# Patient Record
Sex: Male | Born: 1990 | Race: Black or African American | Hispanic: No | Marital: Married | Smoking: Heavy tobacco smoker
Health system: Southern US, Community
[De-identification: ages and names within clinical notes are randomized; demographics above are authoritative.]

---

## 2013-03-06 ENCOUNTER — Encounter (HOSPITAL_COMMUNITY): Payer: Self-pay | Admitting: *Deleted

## 2013-03-06 ENCOUNTER — Emergency Department (HOSPITAL_COMMUNITY)
Admission: EM | Admit: 2013-03-06 | Discharge: 2013-03-06 | Disposition: A | Discharge status: Home / Self Care | Payer: 59 | Source: Home / Self Care

## 2013-03-06 ENCOUNTER — Other Ambulatory Visit (HOSPITAL_COMMUNITY)
Admission: RE | Admit: 2013-03-06 | Discharge: 2013-03-06 | Disposition: A | Discharge status: Home / Self Care | Payer: 59 | Source: Ambulatory Visit | Attending: Emergency Medicine | Admitting: Emergency Medicine

## 2013-03-06 DIAGNOSIS — Z202 Contact with and (suspected) exposure to infections with a predominantly sexual mode of transmission: Secondary | ICD-10-CM

## 2013-03-06 DIAGNOSIS — Z113 Encounter for screening for infections with a predominantly sexual mode of transmission: Secondary | ICD-10-CM | POA: Insufficient documentation

## 2013-03-06 DIAGNOSIS — Z2089 Contact with and (suspected) exposure to other communicable diseases: Secondary | ICD-10-CM

## 2013-03-06 LAB — HIV ANTIBODY (ROUTINE TESTING W REFLEX): HIV: NONREACTIVE

## 2013-03-06 MED ORDER — AZITHROMYCIN 250 MG PO TABS
1000.0000 mg | ORAL_TABLET | Freq: Once | ORAL | Status: AC
  Administered 2013-03-06: 1000 mg via ORAL

## 2013-03-06 MED ORDER — AZITHROMYCIN 250 MG PO TABS
ORAL_TABLET | ORAL | Status: AC
  Filled 2013-03-06: qty 4

## 2013-03-06 MED ORDER — CEFTRIAXONE SODIUM 250 MG IJ SOLR
INTRAMUSCULAR | Status: AC
  Filled 2013-03-06: qty 250

## 2013-03-06 MED ORDER — CEFTRIAXONE SODIUM 250 MG IJ SOLR
250.0000 mg | Freq: Once | INTRAMUSCULAR | Status: AC
  Administered 2013-03-06: 250 mg via INTRAMUSCULAR

## 2013-03-06 MED ORDER — LIDOCAINE HCL (PF) 1 % IJ SOLN
INTRAMUSCULAR | Status: AC
  Filled 2013-03-06: qty 5

## 2013-03-06 NOTE — ED Provider Notes (Signed)
Chief Complaint:   Chief Complaint  Patient presents with  . Exposure to STD    History of Present Illness:   Adam Rich is a 22 year old male who's wife recently told him that she tested positive for Chlamydia. He is married and a mutually monogamous relationship. The last time here them had any relations with anyone outside the marriage was over a year ago, before they got married. He himself is asymptomatic as is his wife. He denies any urethral discharge, burning, dysuria, or penile lesions. He's had no adenopathy, testicular pain or swelling, fever, chills, or skin rash.  Review of Systems:  Other than noted above, the patient denies any of the following symptoms: Systemic:  No fevers chills, aches, weight loss, arthralgias, myalgias, or adenopathy. GI:  No abdominal pain, nausea or vomiting. GU:  No dysuria, penile pain, discharge, itching, dysuria, genital lesions, testicular pain or swelling. Skin:  No rash or itching.  PMFSH:  Past medical history, family history, social history, meds, and allergies were reviewed.   Physical Exam:   Vital signs:  BP 120/80  Pulse 88  Temp(Src) 98.6 F (37 C) (Oral)  Resp 14  SpO2 98% Gen:  Alert, oriented, in no distress. Abdomen:  Soft and flat, non-distended, and non-tender.  No organomegaly or mass. Genital:  Normal genital exam. No urethral discharge, no penile lesions, no inguinal adenopathy, testes are normal in size, nontender, and without any masses. Skin:  Warm and dry.  No rash.   Labs:  DNA probes for gonorrhea, Chlamydia, Trichomonas obtained as well as serologies for HIV and syphilis.   Course in Urgent Care Center:  He was given Rocephin 250 mg IM and azithromycin 1000 mg by mouth.  Assessment:  The encounter diagnosis was Exposure to STD.  Plan:   1.  The following meds were prescribed:  There are no discharge medications for this patient.  2.  The patient was instructed in symptomatic care and handouts were  given. 3.  The patient was told to return if becoming worse in any way, if no better in 3 or 4 days, and given some red flag symptoms such as difficulty urinating or fever that would indicate earlier return. 4.  The patient was instructed to inform all sexual contacts, avoid intercourse completely for 2 weeks and then only with a condom.  The patient was told that we would call about all abnormal lab results, and that we would need to report certain kinds of infection to the health department. 5.  Follow up here if needed.    Reuben Likes, MD 03/06/13 [phone]

## 2013-03-06 NOTE — ED Notes (Signed)
Patient states that he was exposed to chlamydia by his wife 2-3 days ago; denies dysuria, fever/chills.

## 2013-03-09 NOTE — ED Notes (Signed)
GC neg., Chlamydia pos., Trich neg., HIV/RPR non-reactive.  Pt. Adequately treated with Zithromax and also got Rocephin.  Pt. needs notified.

## 2013-03-10 ENCOUNTER — Telehealth (HOSPITAL_COMMUNITY): Payer: Self-pay | Admitting: *Deleted

## 2013-03-10 NOTE — ED Notes (Signed)
I called pt.  Pt. verified x 2 and given results.  Pt. told he was adequately treated with Zithromax.  Pt. instructed to notify his partner, no sex for 1 week and to practice safe sex. Pt. told he should get HIV rechecked in 6 mos. at the [facility] STD clinic, by appointment.  [facility] form completed and faxed to the [facility]. Vassie Moselle 03/10/2013

## 2015-07-20 ENCOUNTER — Emergency Department (HOSPITAL_COMMUNITY)
Admission: EM | Admit: 2015-07-20 | Discharge: 2015-07-20 | Disposition: A | Discharge status: Home / Self Care | Payer: 59 | Attending: Emergency Medicine | Admitting: Emergency Medicine

## 2015-07-20 ENCOUNTER — Emergency Department (HOSPITAL_COMMUNITY): Payer: 59

## 2015-07-20 ENCOUNTER — Encounter (HOSPITAL_COMMUNITY): Payer: Self-pay | Admitting: Emergency Medicine

## 2015-07-20 DIAGNOSIS — W11XXXA Fall on and from ladder, initial encounter: Secondary | ICD-10-CM | POA: Diagnosis not present

## 2015-07-20 DIAGNOSIS — Y9389 Activity, other specified: Secondary | ICD-10-CM | POA: Insufficient documentation

## 2015-07-20 DIAGNOSIS — S3992XA Unspecified injury of lower back, initial encounter: Secondary | ICD-10-CM | POA: Insufficient documentation

## 2015-07-20 DIAGNOSIS — M545 Low back pain, unspecified: Secondary | ICD-10-CM

## 2015-07-20 DIAGNOSIS — Y9289 Other specified places as the place of occurrence of the external cause: Secondary | ICD-10-CM | POA: Diagnosis not present

## 2015-07-20 DIAGNOSIS — Y990 Civilian activity done for income or pay: Secondary | ICD-10-CM | POA: Diagnosis not present

## 2015-07-20 DIAGNOSIS — F17210 Nicotine dependence, cigarettes, uncomplicated: Secondary | ICD-10-CM | POA: Insufficient documentation

## 2015-07-20 MED ORDER — KETOROLAC TROMETHAMINE 30 MG/ML IJ SOLN
30.0000 mg | Freq: Once | INTRAMUSCULAR | Status: AC
  Administered 2015-07-20: 30 mg via INTRAMUSCULAR
  Filled 2015-07-20: qty 1

## 2015-07-20 NOTE — ED Provider Notes (Signed)
CSN: [id number]     Arrival date & time 07/20/15  0906 History   None    Chief Complaint  Patient presents with  . Fall  . Back Pain   (Consider location/radiation/quality/duration/timing/severity/associated sxs/prior Treatment) HPI  24 y.o. male presents to the Emergency Department today complaining of lower back pain. The patient fell from a ladder 10+ feet on 07-14-15 onto gravel while at work. No head trauma or LOC. He was able to ambulate at the scene afterwards and carry on with his tasks that day. No loss of bowel or bladder function. Presents today because the pain has not been getting any better. Pain is 6/10. He tried Ibuprofen 1 tab with little relief. Pt also complains of L shoulder pain. No numbness/tingling.     History reviewed. No pertinent past medical history. History reviewed. No pertinent past surgical history. History reviewed. No pertinent family history. Social History  Substance Use Topics  . Smoking status: Heavy Tobacco Smoker -- 0.50 packs/day    Types: Cigarettes  . Smokeless tobacco: None  . Alcohol Use: Yes     Comment: occasionlly    Review of Systems  Constitutional: Negative for fever, chills, diaphoresis and fatigue.  HENT: Negative for congestion, sinus pressure, sore throat and tinnitus.   Eyes: Negative for visual disturbance.  Respiratory: Negative for cough and shortness of breath.   Cardiovascular: Negative for chest pain.  Gastrointestinal: Negative for nausea, vomiting, abdominal pain, diarrhea and constipation.  Endocrine: Negative for cold intolerance and heat intolerance.  Genitourinary: Negative for dysuria and hematuria.  Musculoskeletal: Positive for back pain.  Skin: Negative for color change.  Neurological: Negative for dizziness, syncope, weakness, numbness and headaches.   Allergies  Review of patient's allergies indicates no known allergies.  Home Medications   Prior to Admission medications   Not on File   BP 146/84  mmHg  Pulse 100  Temp(Src) 99.4 F (37.4 C) (Oral)  Resp 20  SpO2 98% Physical Exam  Constitutional: He is oriented to person, place, and time. Vital signs are normal. He appears well-developed and well-nourished.  HENT:  Head: Normocephalic.  Right Ear: Hearing normal.  Left Ear: Hearing normal.  Eyes: Conjunctivae and EOM are normal. Pupils are equal, round, and reactive to light.  Cardiovascular: Normal rate and regular rhythm.   Pulmonary/Chest: Effort normal and breath sounds normal.  Musculoskeletal:       Left shoulder: Normal. He exhibits normal range of motion, no tenderness, no bony tenderness, no deformity and no pain.       Cervical back: Normal. He exhibits normal range of motion and no tenderness.       Thoracic back: Normal. He exhibits normal range of motion and no tenderness.       Lumbar back: He exhibits tenderness. He exhibits normal range of motion.  Neurological: He is alert and oriented to person, place, and time. He has normal strength. No cranial nerve deficit or sensory deficit.  Skin: Skin is warm and dry.  Psychiatric: He has a normal mood and affect. His speech is normal and behavior is normal. Thought content normal.    ED Course  Procedures (including critical care time) Labs Review Labs Reviewed - No data to display  Imaging Review Dg Lumbar Spine Complete  07/20/2015  CLINICAL DATA:  Fall 10 feet off ladder 2 days ago.  Low back pain. EXAM: LUMBAR SPINE - COMPLETE 4+ VIEW COMPARISON:  None. FINDINGS: There is no evidence of lumbar spine fracture. Alignment is  normal. Intervertebral disc spaces are maintained. IMPRESSION: Negative. Electronically Signed   By: Charlett NoseKevin  Dover M.D.   On: 07/20/2015 11:20   I have personally reviewed and evaluated these images and lab results as part of my medical decision-making.   EKG Interpretation None      MDM  I have reviewed relevant imaging studies.I obtained HPI from historian.  ED Course: Toradol 30  IM Lumbar Xray- Negative  Assessment: 24yM no sig pmh with lower back pain after fall 310ft from ladder onto gravel. No LOC, No loss of bowel or bladder. VS stable. Pt in NAD. Full ROM L shoulder and Lumbar spine. Pt able to ambulate well. Due to tenderness on palpation of L spine, will order Xray of Lumbar for further evaluation. If unchanged, will d/c with paperwork on back exercises and to take NSAIDs for relief. Follow up with PCP for further management.    Disposition/Plan:  DC home w/ follow up to PCP Additional Verbal discharge instructions given and discussed with patient.  Pt Instructed to f/u with PCP in the next 48 hours for evaluation and treatment of symptoms.   Supervising Physician Cathren LaineKevin Steinl, MD   Final diagnoses:  Bilateral low back pain without sciatica       Audry Piliyler Billyjoe Go, PA-C 07/20/15 2240  Cathren LaineKevin Steinl, MD 07/22/15 [id number]

## 2015-07-20 NOTE — ED Notes (Signed)
Pt c/o lower back pain radiating to shoulder onset after fall from 10 feet onto gravel on 07-14-15. No head injury injury, no LOC, no anticoagulant therapy. No loss of control of bowel or bladder, ambulatory.

## 2015-07-20 NOTE — ED Notes (Signed)
Bed: [id number] Expected date:  Expected time:  Means of arrival:  Comments: Tr 2

## 2015-07-20 NOTE — Discharge Instructions (Signed)
Please read and follow all provided instructions.  Your diagnoses today include:  1. Bilateral low back pain without sciatica    Tests performed today include:  Vital signs - see below for your results today  Medications prescribed:  None  Home care instructions:   Follow any educational materials contained in this packet  Please rest, use ice or heat on your back for the next several days  Do not lift, push, pull anything more than 10 pounds for the next week  Follow-up instructions: Please follow-up with your primary care provider in the next 48 hours for treatment of further symptoms   Return instructions:  SEEK IMMEDIATE MEDICAL ATTENTION IF YOU HAVE:  New numbness, tingling, weakness, or problem with the use of your arms or legs  Severe back pain not relieved with medications  Loss control of your bowels or bladder  Increasing pain in any areas of the body (such as chest or abdominal pain)  Shortness of breath, dizziness, or fainting.   Worsening nausea (feeling sick to your stomach), vomiting, fever, or sweats  Any other emergent concerns regarding your health   Additional Information:  Your vital signs today were: BP 146/84 mmHg   Pulse 100   Temp(Src) 99.4 F (37.4 C) (Oral)   Resp 20   SpO2 98% If your blood pressure (BP) was elevated above 135/85 this visit, please have this repeated by your doctor within one month. --------------

## 2015-07-20 NOTE — ED Notes (Signed)
Bed: WA26 Expected date:  Expected time:  Means of arrival:  Comments: 

## 2017-05-06 IMAGING — CR DG LUMBAR SPINE COMPLETE 4+V
5 series · 5 of 5 positions shown · non-contrast
Comparison: None.

CLINICAL DATA: Fall 10 feet off ladder 2 days ago.  Low back pain.

EXAM:
LUMBAR SPINE - COMPLETE 4+ VIEW

[t lumbar spine ap]
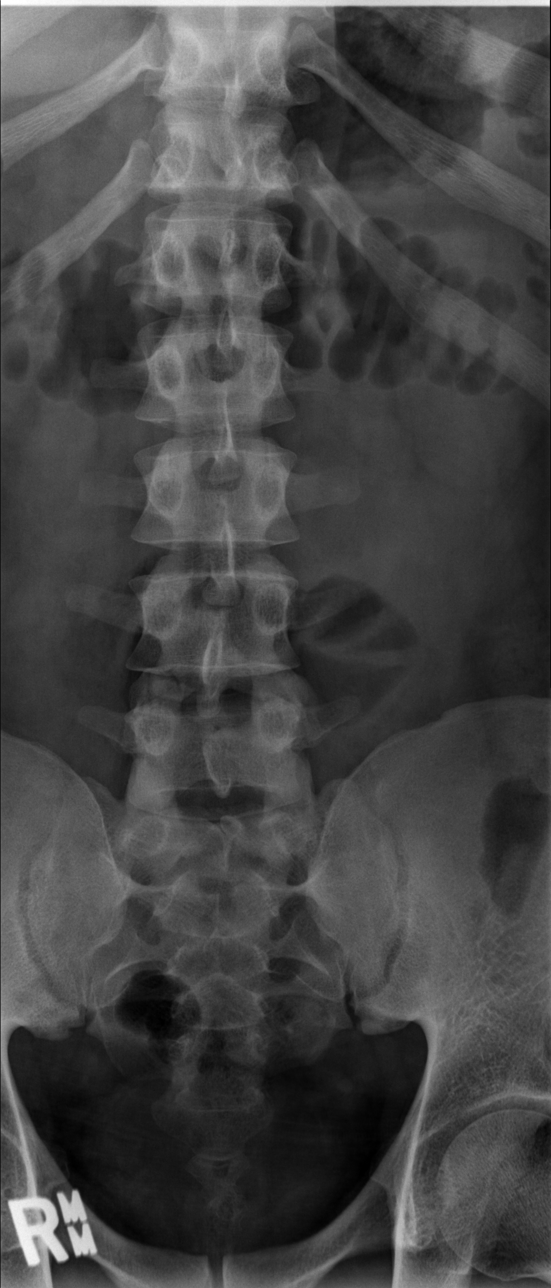

[t lumbar spine obl (1 of 2)]
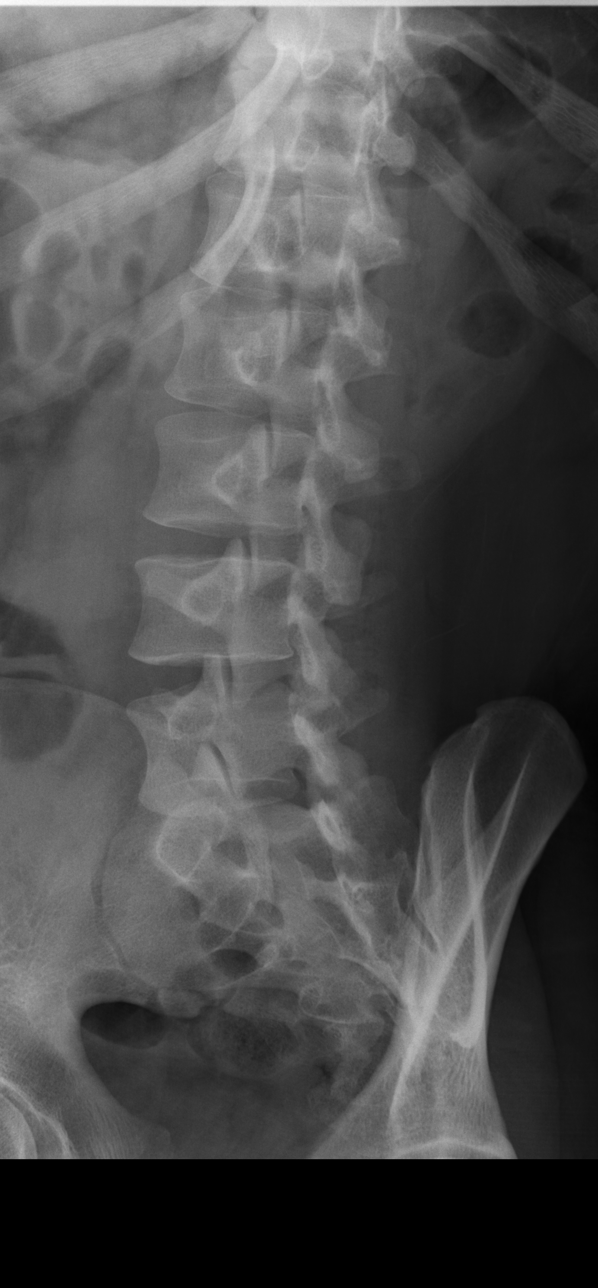

[t lumbar spine obl (2 of 2)]
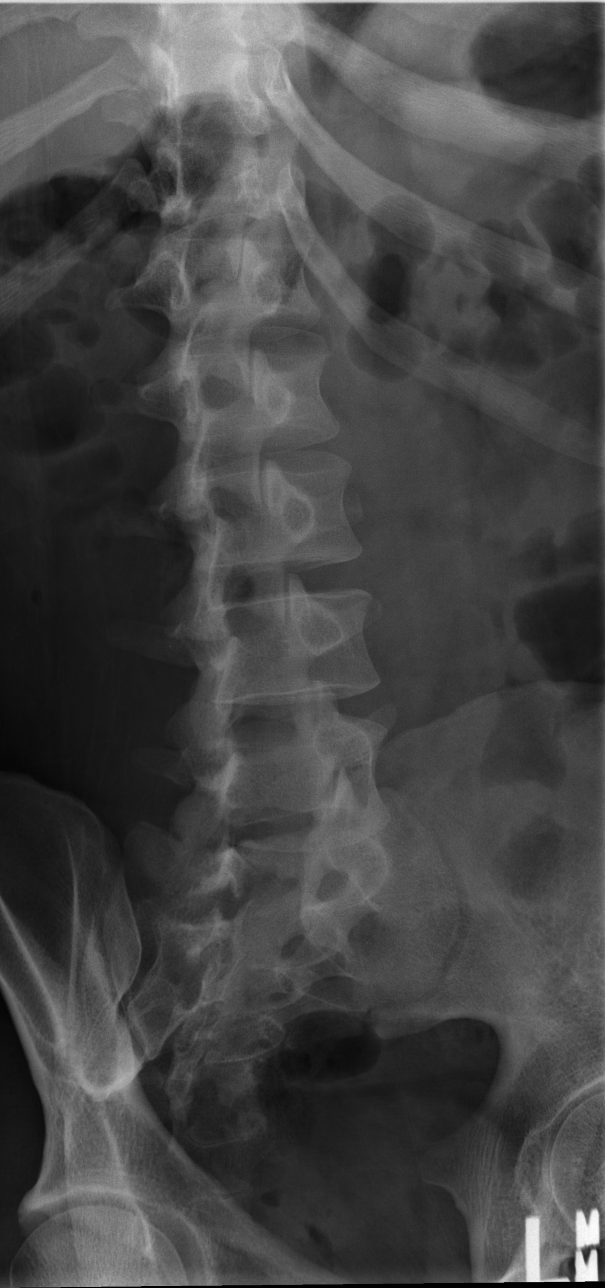

[t lumbar spine lat]
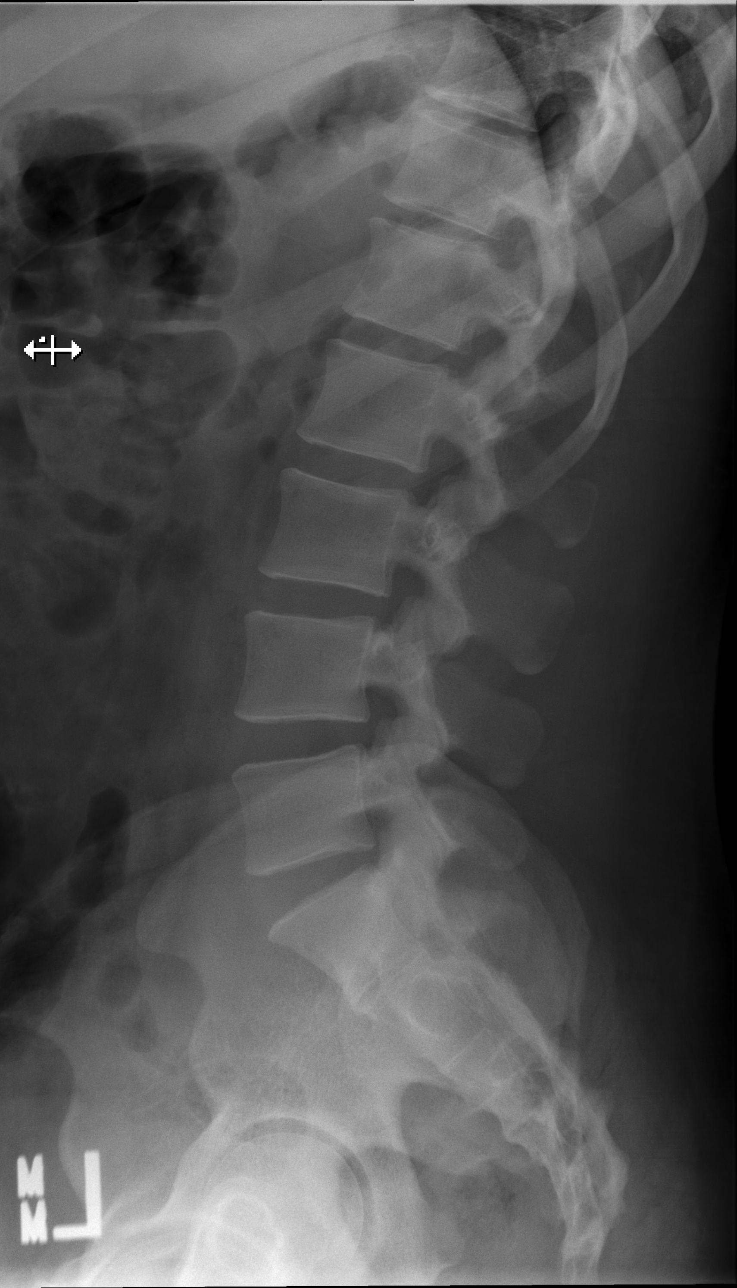

[t lumbar l-5 s-1 spot]
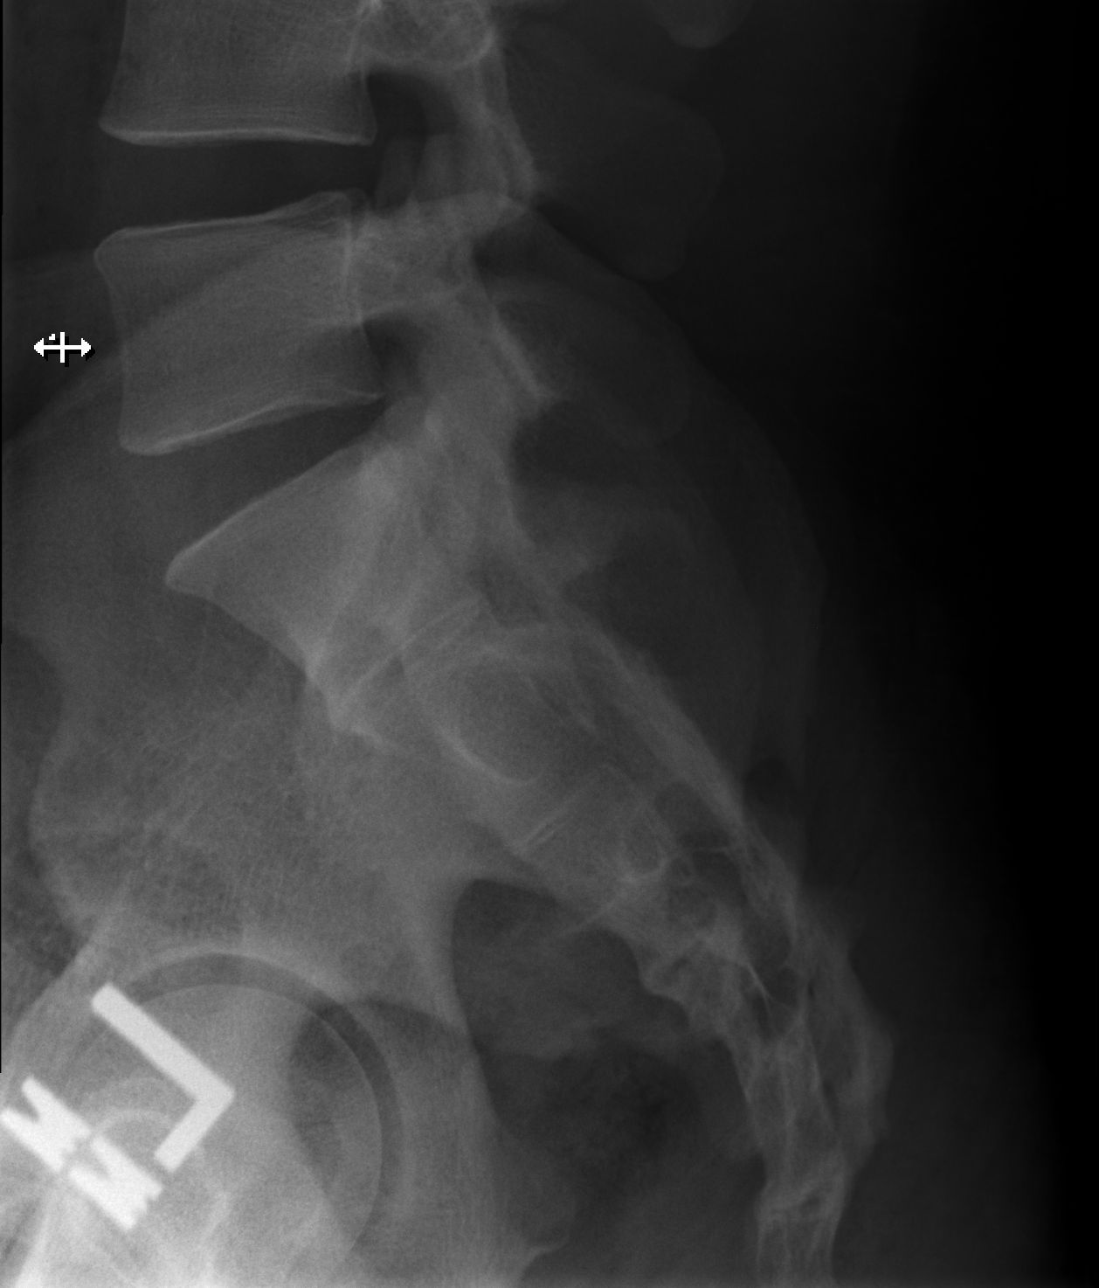

[5 of 5 positions shown; findings below may reference images not displayed]

FINDINGS: There is no evidence of lumbar spine fracture. Alignment is normal.
Intervertebral disc spaces are maintained.
IMPRESSION: Negative.
